# Patient Record
Sex: Female | Born: 1995 | Race: Black or African American | Hispanic: No | Marital: Single | Smoking: Never smoker
Health system: Southern US, Community
[De-identification: ages and names within clinical notes are randomized; demographics above are authoritative.]

## PROBLEM LIST (undated history)

## (undated) DIAGNOSIS — T7840XA Allergy, unspecified, initial encounter: Secondary | ICD-10-CM

## (undated) DIAGNOSIS — N766 Ulceration of vulva: Secondary | ICD-10-CM

## (undated) HISTORY — DX: Ulceration of vulva: N76.6

## (undated) HISTORY — DX: Allergy, unspecified, initial encounter: T78.40XA

---
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
## 2013-02-05 ENCOUNTER — Ambulatory Visit (INDEPENDENT_AMBULATORY_CARE_PROVIDER_SITE_OTHER): Payer: BC Managed Care – PPO | Admitting: Family Medicine

## 2013-02-05 VITALS — BP 110/70 | HR 68 | Temp 98.1°F | Resp 18 | Ht 64.0 in | Wt 141.0 lb

## 2013-02-05 DIAGNOSIS — Z020 Encounter for examination for admission to educational institution: Secondary | ICD-10-CM

## 2013-02-05 DIAGNOSIS — Z0000 Encounter for general adult medical examination without abnormal findings: Secondary | ICD-10-CM

## 2013-02-05 DIAGNOSIS — Z23 Encounter for immunization: Secondary | ICD-10-CM

## 2013-02-05 LAB — POCT URINALYSIS DIPSTICK
Leukocytes, UA: NEGATIVE
Nitrite, UA: NEGATIVE
Protein, UA: NEGATIVE
Urobilinogen, UA: 0.2
pH, UA: 6.5

## 2013-02-05 LAB — POCT CBC
Granulocyte percent: 66.8 %G (ref 37–80)
HCT, POC: 39.5 % (ref 37.7–47.9)
MCV: 93.4 fL (ref 80–97)
RBC: 4.23 M/uL (ref 4.04–5.48)

## 2013-02-05 NOTE — Patient Instructions (Addendum)
Go the the [facility] on [address] to get your 2nd Varicella (chicken pox) vaccine.  You can return in 2 months for your second Gardasil (HPV) vaccine - you will not need to see a doctor for this - will be a quick in-and-out visit just to get your shot.  Medroxyprogesterone injection [Contraceptive] What is this medicine? MEDROXYPROGESTERONE (me DROX ee proe JES te rone) contraceptive injections prevent pregnancy. They provide effective birth control for 3 months. Depo-subQ Provera 104 is also used for treating pain related to endometriosis. This medicine may be used for other purposes; ask your health care provider or pharmacist if you have questions. What should I tell my health care provider before I take this medicine? They need to know if you have any of these conditions: -frequently drink alcohol -asthma -blood vessel disease or a history of a blood clot in the lungs or legs -bone disease such as osteoporosis -breast cancer -diabetes -eating disorder (anorexia nervosa or bulimia) -high blood pressure -HIV infection or AIDS -kidney disease -liver disease -mental depression -migraine -seizures (convulsions) -stroke -tobacco smoker -vaginal bleeding -an unusual or allergic reaction to medroxyprogesterone, other hormones, medicines, foods, dyes, or preservatives -pregnant or trying to get pregnant -breast-feeding How should I use this medicine? Depo-Provera Contraceptive injection is given into a muscle. Depo-subQ Provera 104 injection is given under the skin. These injections are given by a health care professional. You must not be pregnant before getting an injection. The injection is usually given during the first 5 days after the start of a menstrual period or 6 weeks after delivery of a baby. Talk to your pediatrician regarding the use of this medicine in children. Special care may be needed. These injections have been used in female children who have  started having menstrual periods. Overdosage: If you think you have taken too much of this medicine contact a poison control center or emergency room at once. NOTE: This medicine is only for you. Do not share this medicine with others. What if I miss a dose? Try not to miss a dose. You must get an injection once every 3 months to maintain birth control. If you cannot keep an appointment, call and reschedule it. If you wait longer than 13 weeks between Depo-Provera contraceptive injections or longer than 14 weeks between Depo-subQ Provera 104 injections, you could get pregnant. Use another method for birth control if you miss your appointment. You may also need a pregnancy test before receiving another injection. What may interact with this medicine? Do not take this medicine with any of the following medications: -bosentan This medicine may also interact with the following medications: -aminoglutethimide -antibiotics or medicines for infections, especially rifampin, rifabutin, rifapentine, and griseofulvin -aprepitant -barbiturate medicines such as phenobarbital or primidone -bexarotene -carbamazepine -medicines for seizures like ethotoin, felbamate, oxcarbazepine, phenytoin, topiramate -modafinil -St. John's wort This list may not describe all possible interactions. Give your health care provider a list of all the medicines, herbs, non-prescription drugs, or dietary supplements you use. Also tell them if you smoke, drink alcohol, or use illegal drugs. Some items may interact with your medicine. What should I watch for while using this medicine? This drug does not protect you against HIV infection (AIDS) or other sexually transmitted diseases. Use of this product may cause you to lose calcium from your bones. Loss of calcium may cause weak bones (osteoporosis). Only use this product for more than 2 years if other forms of birth control are not right for  you. The longer you use this product for  birth control the more likely you will be at risk for weak bones. Ask your health care professional how you can keep strong bones. You may have a change in bleeding pattern or irregular periods. Many females stop having periods while taking this drug. If you have received your injections on time, your chance of being pregnant is very low. If you think you may be pregnant, see your health care professional as soon as possible. Tell your health care professional if you want to get pregnant within the next year. The effect of this medicine may last a long time after you get your last injection. What side effects may I notice from receiving this medicine? Side effects that you should report to your doctor or health care professional as soon as possible: -allergic reactions like skin rash, itching or hives, swelling of the face, lips, or tongue -breast tenderness or discharge -breathing problems -changes in vision -depression -feeling faint or lightheaded, falls -fever -pain in the abdomen, chest, groin, or leg -problems with balance, talking, walking -unusually weak or tired -yellowing of the eyes or skin Side effects that usually do not require medical attention (report to your doctor or health care professional if they continue or are bothersome): -acne -fluid retention and swelling -headache -irregular periods, spotting, or absent periods -temporary pain, itching, or skin reaction at site where injected -weight gain This list may not describe all possible side effects. Call your doctor for medical advice about side effects. You may report side effects to FDA at 1-800-FDA-1088. Where should I keep my medicine? This does not apply. The injection will be given to you by a health care professional. NOTE: This sheet is a summary. It may not cover all possible information. If you have questions about this medicine, talk to your doctor, pharmacist, or health care provider.  2013, Elsevier/[employer]  Standard. (08/10/2008 6:37:56 PM)

## 2013-02-05 NOTE — Progress Notes (Signed)
Subjective:    Patient ID: Andrea Ramirez, female    DOB: Nov 02, 1995, 17 y.o.   MRN: [id number] Chief Complaint  Patient presents with  . 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  Occasionally has RLQ pelvic pressure for the past sev years - saw Dr. Normand Sloop, ob-gyn, for this and was put on naproxyn which helps some.  She was put on ortho-evra patch for dysmenorrhea. It did not help at all, it was hard to take off, and left a light patch on her skin so she stopped it a while ago. She cannot remember to take a pill every day and she does not want an implant in her arm.  She would consider Depo-Provera but was turned off by the weight gain.  She brought up her immunizations records with her. She is UTD on all required vaccines. Her TDaP will be due in 01/2017.  She has only received 1 varicella vaccine - in 1999, and has not received the HPV vaccines or meningococal vaccine.  She is UTD on all others inculding her Hep B series (not Hep A). Virgin, not sexually active. She did get a ppd test within the past year for her medical careers class.  Past Medical History  Diagnosis Date  . Genital labial ulcer 05/11/08  . Allergy    Current Outpatient Prescriptions on File Prior to Visit  Medication Sig Dispense Refill  . naproxen sodium (ANAPROX) 275 MG tablet Take 1 tablet (275 mg total) by mouth 2 (two) times daily with a meal.  60 tablet  2  . norelgestromin-ethinyl estradiol (ORTHO EVRA) 150-20 MCG/24HR transdermal patch Place 1 patch onto the skin once a week.  3 patch  12   No current facility-administered medications on file prior to visit.   No Known Allergies History reviewed. No pertinent past surgical history. Family History  Problem Relation Age of Onset  . Diabetes Father   . Hypertension Father   . Hypertension Maternal Grandfather    History   Social History  .  Marital Status: Single    Spouse Name: N/A    Number of Children: N/A  . Years of Education: N/A   Social History Main Topics  . Smoking status: Never Smoker   . Smokeless tobacco: Never Used  . Alcohol Use: No  . Drug Use: No  . Sexually Active: No   Other Topics Concern  . None   Social History Narrative  . None     Review of Systems  Respiratory: Negative for cough and shortness of breath.   Cardiovascular: Negative for chest pain.  Genitourinary: Negative for dysuria and vaginal discharge.  Skin: Negative for rash.  All other systems reviewed and are negative.       BP 110/70  Pulse 68  Temp(Src) 98.1 F (36.7 C) (Oral)  Resp 18  Ht 5\' 4"  (1.626 m)  Wt 141 lb (63.957 kg)  BMI 24.19 kg/m2  SpO2 99%  LMP 01/18/2013 Objective:   Physical Exam  Constitutional: She is oriented to person, place, and time. She appears well-developed and well-nourished. No distress.  HENT:  Head: Normocephalic and atraumatic.  Right Ear: Tympanic membrane, external ear and ear canal normal.  Left Ear: Tympanic membrane, external ear and ear canal normal.  Nose: Nose normal. No mucosal edema or rhinorrhea.  Mouth/Throat: Uvula is midline, oropharynx is clear and  moist and mucous membranes are normal. No posterior oropharyngeal erythema.  Eyes: Conjunctivae and EOM are normal. Pupils are equal, round, and reactive to light. Right eye exhibits no discharge. Left eye exhibits no discharge. No scleral icterus.  Neck: Normal range of motion. Neck supple. No thyromegaly present.  Cardiovascular: Normal rate, regular rhythm, normal heart sounds and intact distal pulses.   Pulmonary/Chest: Effort normal and breath sounds normal. No respiratory distress.  Abdominal: Soft. Bowel sounds are normal. There is no tenderness.  Musculoskeletal: She exhibits no edema.  Lymphadenopathy:    She has no cervical adenopathy.  Neurological: She is alert and oriented to person, place, and time. She has  normal reflexes.  Skin: Skin is warm and dry. She is not diaphoretic. No erythema.  Psychiatric: She has a normal mood and affect. Her behavior is normal.          Results for orders placed in visit on 02/05/13  POCT URINALYSIS DIPSTICK      Result Value Range   Color, UA yellow     Clarity, UA clear     Glucose, UA neg     Bilirubin, UA neg     Ketones, UA neg     Spec Grav, UA 1.015     Blood, UA trace     pH, UA 6.5     Protein, UA neg     Urobilinogen, UA 0.2     Nitrite, UA neg     Leukocytes, UA Negative    POCT CBC      Result Value Range   WBC 6.9  4.6 - 10.2 K/uL   Lymph, poc 1.7  0.6 - 3.4   POC LYMPH PERCENT 25.3  10 - 50 %L   MID (cbc) 0.5  0 - 0.9   POC MID % 7.9  0 - 12 %M   POC Granulocyte 4.6  2 - 6.9   Granulocyte percent 66.8  37 - 80 %G   RBC 4.23  4.04 - 5.48 M/uL   Hemoglobin 12.4  12.2 - 16.2 g/dL   HCT, POC 40.9  81.1 - 47.9 %   MCV 93.4  80 - 97 fL   MCH, POC 29.3  27 - 31.2 pg   MCHC 31.4 (*) 31.8 - 35.4 g/dL   RDW, POC 91.4     Platelet Count, POC 261  142 - 424 K/uL   MPV 8.3  0 - 99.8 fL    Assessment & Plan:  Immunizations - recommend meningococcal vaccine and test today for varicilla immunity.  Had ppd results from other clinic. Rec starting HPV series.  Pt elected to get meningococcal vaccine and Gardasil #1 today. Will go to [facility] for varicella immunization.  HM - No concerns or restrictions, doing great. School forms completed and scanned in.  Dysmenorrhea - consider depo-provera - discussed risks/benefits.  Pt declines at this time - will just put up with sxs but will RTC if changes her mind.

## 2013-04-20 ENCOUNTER — Ambulatory Visit (INDEPENDENT_AMBULATORY_CARE_PROVIDER_SITE_OTHER): Payer: BC Managed Care – PPO

## 2013-04-20 DIAGNOSIS — Z23 Encounter for immunization: Secondary | ICD-10-CM

## 2013-10-09 ENCOUNTER — Ambulatory Visit (INDEPENDENT_AMBULATORY_CARE_PROVIDER_SITE_OTHER): Payer: 59 | Admitting: *Deleted

## 2013-10-09 DIAGNOSIS — Z23 Encounter for immunization: Secondary | ICD-10-CM

## 2014-12-28 ENCOUNTER — Ambulatory Visit (INDEPENDENT_AMBULATORY_CARE_PROVIDER_SITE_OTHER): Payer: BLUE CROSS/BLUE SHIELD | Admitting: Family Medicine

## 2014-12-28 VITALS — BP 98/64 | HR 74 | Temp 98.9°F | Resp 16 | Ht 64.5 in | Wt 136.0 lb

## 2014-12-28 DIAGNOSIS — Z111 Encounter for screening for respiratory tuberculosis: Secondary | ICD-10-CM

## 2014-12-28 NOTE — Progress Notes (Signed)
Patient is here for a PPD skin test. No history of TB exposure. Has hallux had negative tests in the past.  Objective: Healthy young lady  Assessment: PPD skin testing  Plan: Return in 48-72 hours tender test interpreted

## 2014-12-28 NOTE — Progress Notes (Signed)

## 2014-12-28 NOTE — Patient Instructions (Signed)
Return in 48-72 hours to have the skin test interpretation. If outside of that timeframe it is considered an invalid test.

## 2014-12-31 ENCOUNTER — Ambulatory Visit (INDEPENDENT_AMBULATORY_CARE_PROVIDER_SITE_OTHER): Payer: BLUE CROSS/BLUE SHIELD | Admitting: *Deleted

## 2014-12-31 DIAGNOSIS — Z111 Encounter for screening for respiratory tuberculosis: Secondary | ICD-10-CM

## 2014-12-31 LAB — TB SKIN TEST
Induration: 0 mm
TB Skin Test: NEGATIVE

## 2014-12-31 NOTE — Progress Notes (Signed)
Patient here today for TB read. Results negative 0mm. 

## 2015-04-02 ENCOUNTER — Ambulatory Visit (INDEPENDENT_AMBULATORY_CARE_PROVIDER_SITE_OTHER): Payer: BLUE CROSS/BLUE SHIELD | Admitting: Urgent Care

## 2015-04-02 VITALS — BP 110/78 | HR 79 | Temp 98.8°F | Resp 16 | Ht 64.5 in | Wt 139.2 lb

## 2015-04-02 DIAGNOSIS — M62838 Other muscle spasm: Secondary | ICD-10-CM

## 2015-04-02 DIAGNOSIS — M6248 Contracture of muscle, other site: Secondary | ICD-10-CM

## 2015-04-02 DIAGNOSIS — M549 Dorsalgia, unspecified: Secondary | ICD-10-CM

## 2015-04-02 DIAGNOSIS — M25519 Pain in unspecified shoulder: Secondary | ICD-10-CM

## 2015-04-02 DIAGNOSIS — M25562 Pain in left knee: Secondary | ICD-10-CM | POA: Diagnosis not present

## 2015-04-02 MED ORDER — MELOXICAM 15 MG PO TABS: 7.5000 mg | ORAL_TABLET | Freq: Every day | ORAL | Status: DC

## 2015-04-02 MED ORDER — CYCLOBENZAPRINE HCL 10 MG PO TABS: 5.0000 mg | ORAL_TABLET | Freq: Three times a day (TID) | ORAL | Status: DC | PRN
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
## 2015-04-02 NOTE — Patient Instructions (Signed)

## 2016-04-29 ENCOUNTER — Emergency Department (INDEPENDENT_AMBULATORY_CARE_PROVIDER_SITE_OTHER): Payer: BLUE CROSS/BLUE SHIELD

## 2016-04-29 ENCOUNTER — Emergency Department
Admission: EM | Admit: 2016-04-29 | Discharge: 2016-04-29 | Disposition: A | Discharge status: Home / Self Care | Payer: BLUE CROSS/BLUE SHIELD | Source: Home / Self Care | Attending: Emergency Medicine | Admitting: Emergency Medicine

## 2016-04-29 ENCOUNTER — Encounter: Payer: Self-pay | Admitting: Emergency Medicine

## 2016-04-29 DIAGNOSIS — M25519 Pain in unspecified shoulder: Secondary | ICD-10-CM

## 2016-04-29 DIAGNOSIS — M549 Dorsalgia, unspecified: Secondary | ICD-10-CM

## 2016-04-29 DIAGNOSIS — M25562 Pain in left knee: Secondary | ICD-10-CM

## 2016-04-29 DIAGNOSIS — M2141 Flat foot [pes planus] (acquired), right foot: Secondary | ICD-10-CM

## 2016-04-29 DIAGNOSIS — M79671 Pain in right foot: Secondary | ICD-10-CM | POA: Diagnosis not present

## 2016-04-29 MED ORDER — IBUPROFEN 800 MG PO TABS: 800.0000 mg | ORAL_TABLET | Freq: Three times a day (TID) | ORAL | 0 refills | Status: DC

## 2016-04-29 NOTE — Discharge Instructions (Signed)
Follow up with Dr. Denyse Amassorey for recheck in 1 week

## 2016-04-29 NOTE — ED Triage Notes (Signed)
Pt c/o right anterior foot pain that started x2 days ago. No known injury. Pain is worse with movement.

## 2016-04-30 NOTE — ED Provider Notes (Signed)
[facility] DEPT Provider Note   CSN: [id number] Arrival date & time: 04/29/16  1758     History   Chief Complaint Chief Complaint  Patient presents with  . Foot Pain    HPI Andrea Ramirez is a 20 y.o. female.  The history is provided by the patient. No language interpreter was used.  Foot Pain  This is a new problem. The current episode started 2 days ago. The problem occurs constantly. The problem has been gradually worsening. The symptoms are aggravated by walking. Nothing relieves the symptoms. She has tried nothing for the symptoms. The treatment provided no relief.    Past Medical History:  Diagnosis Date  . Allergy   . Genital labial ulcer 05/11/08    Patient Active Problem List   Diagnosis Date Noted  . Dysmenorrhea 02/16/2012    History reviewed. No pertinent surgical history.  OB History    No data available       Home Medications    Prior to Admission medications   Medication Sig Start Date End Date Taking? Authorizing Provider  cyclobenzaprine (FLEXERIL) 10 MG tablet Take 0.5-1 tablets (5-10 mg total) by mouth 3 (three) times daily as needed for muscle spasms. 04/02/15   Wallis Bamberg, PA-C  ibuprofen (ADVIL,MOTRIN) 800 MG tablet Take 1 tablet (800 mg total) by mouth 3 (three) times daily. 04/29/16   Elson Areas, PA-C  meloxicam (MOBIC) 15 MG tablet Take 0.5-1 tablets (7.5-15 mg total) by mouth daily. 04/02/15   Wallis Bamberg, PA-C    Family History Family History  Problem Relation Age of Onset  . Diabetes Father   . Hypertension Father   . Hypertension Maternal Grandfather     Social History Social History  Substance Use Topics  . Smoking status: Never Smoker  . Smokeless tobacco: Never Used  . Alcohol use No     Allergies   Review of patient's allergies indicates no known allergies.   Review of Systems Review of Systems  All other systems reviewed and are negative.    Physical Exam Updated Vital Signs BP 118/77 (BP Location:  Right Arm)   Pulse 81   Temp 98.1 F (36.7 C) (Oral)   Wt 66.7 kg   LMP 01/30/2016   SpO2 99%   BMI 24.84 kg/m   Physical Exam  Constitutional: She is oriented to person, place, and time. She appears well-developed and well-nourished.  HENT:  Head: Normocephalic.  Eyes: EOM are normal.  Neck: Normal range of motion.  Pulmonary/Chest: Effort normal.  Abdominal: She exhibits no distension.  Musculoskeletal: Normal range of motion.  Tender medial right foot,  Pain with moving,  nv and ns intact   Neurological: She is alert and oriented to person, place, and time.  Psychiatric: She has a normal mood and affect.  Nursing note and vitals reviewed.    ED Treatments / Results  Labs (all labs ordered are listed, but only abnormal results are displayed) Labs Reviewed - No data to display  EKG  EKG Interpretation None       Radiology Dg Foot Complete Right  Result Date: 04/29/2016 CLINICAL DATA:  Dorsal and medial foot pain for 2 days. No bruising but mild swelling noted. EXAM: RIGHT FOOT COMPLETE - 3+ VIEW COMPARISON:  None. FINDINGS: There is no evidence of fracture or dislocation. There is no evidence of arthropathy or other focal bone abnormality. Soft tissues are unremarkable. There is pes planus. IMPRESSION: Pes planus of the right foot. Electronically Signed  By: Tollie Ethavid  Kwon M.D.   On: 04/29/2016 19:08    Procedures Procedures (including critical care time)  Medications Ordered in ED Medications - No data to display   Initial Impression / Assessment and Plan / ED Course  I have reviewed the triage vital signs and the nursing notes.  Pertinent labs & imaging results that were available during my care of the patient were reviewed by me and considered in my medical decision making (see chart for details).  Clinical Course      Final Clinical Impressions(s) / ED Diagnoses   Final diagnoses:  Foot pain, right   An After Visit Summary was printed and given to  the patient. New Prescriptions Discharge Medication List as of 04/29/2016  7:17 PM    START taking these medications   Details  ibuprofen (ADVIL,MOTRIN) 800 MG tablet Take 1 tablet (800 mg total) by mouth 3 (three) times daily., Starting Wed 04/29/2016, Normal         Lonia SkinnerLeslie K MurphysSofia, [place] JerseyPA-C 04/30/16 1003

## 2017-04-24 IMAGING — DX DG FOOT COMPLETE 3+V*R*
3 series · 3 of 3 positions shown · non-contrast
Comparison: None.

CLINICAL DATA: Dorsal and medial foot pain for 2 days. No bruising
but mild swelling noted.

EXAM:
RIGHT FOOT COMPLETE - 3+ VIEW

[foot ap]
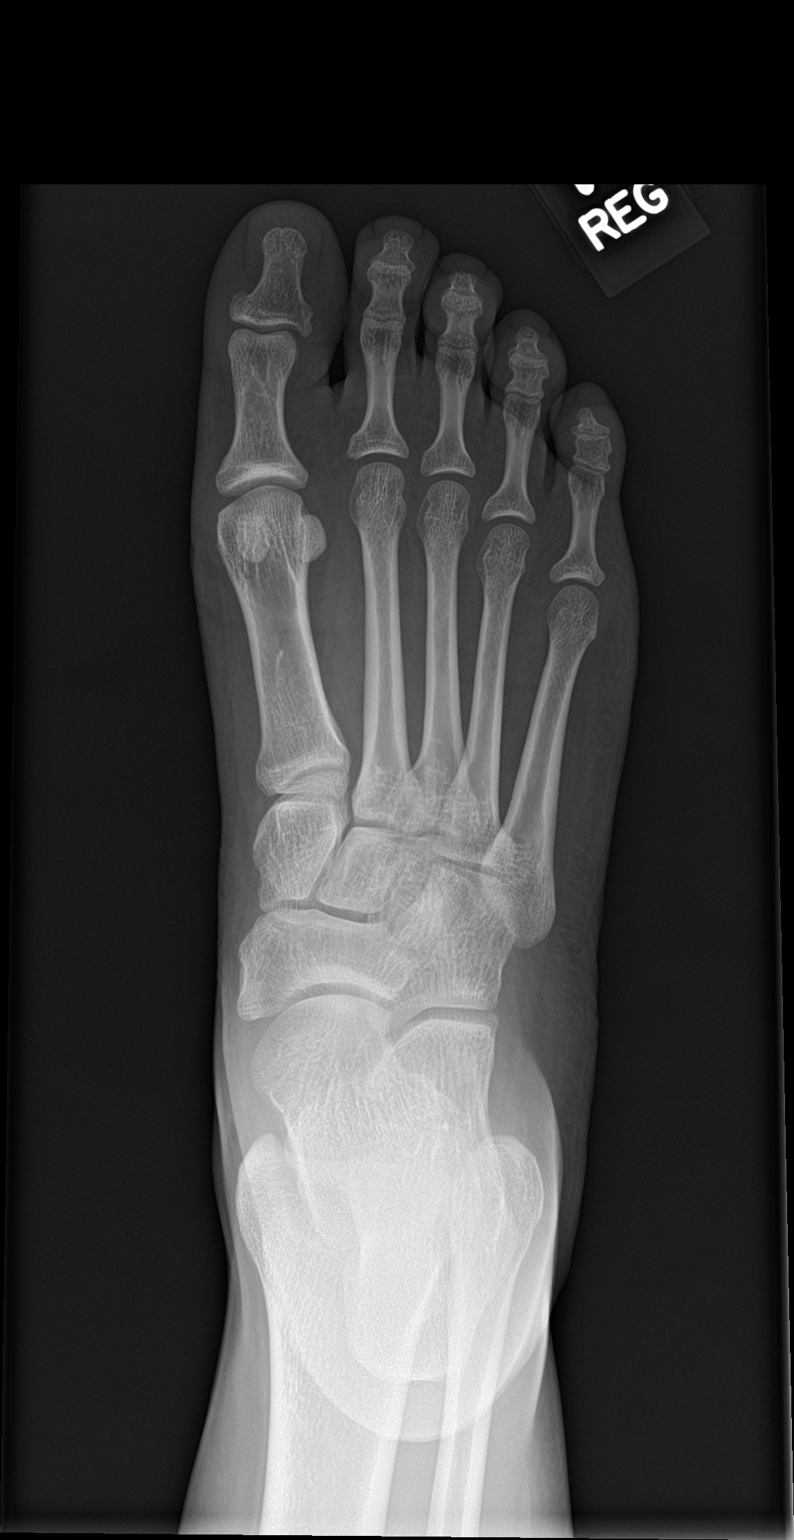

[foot obl]
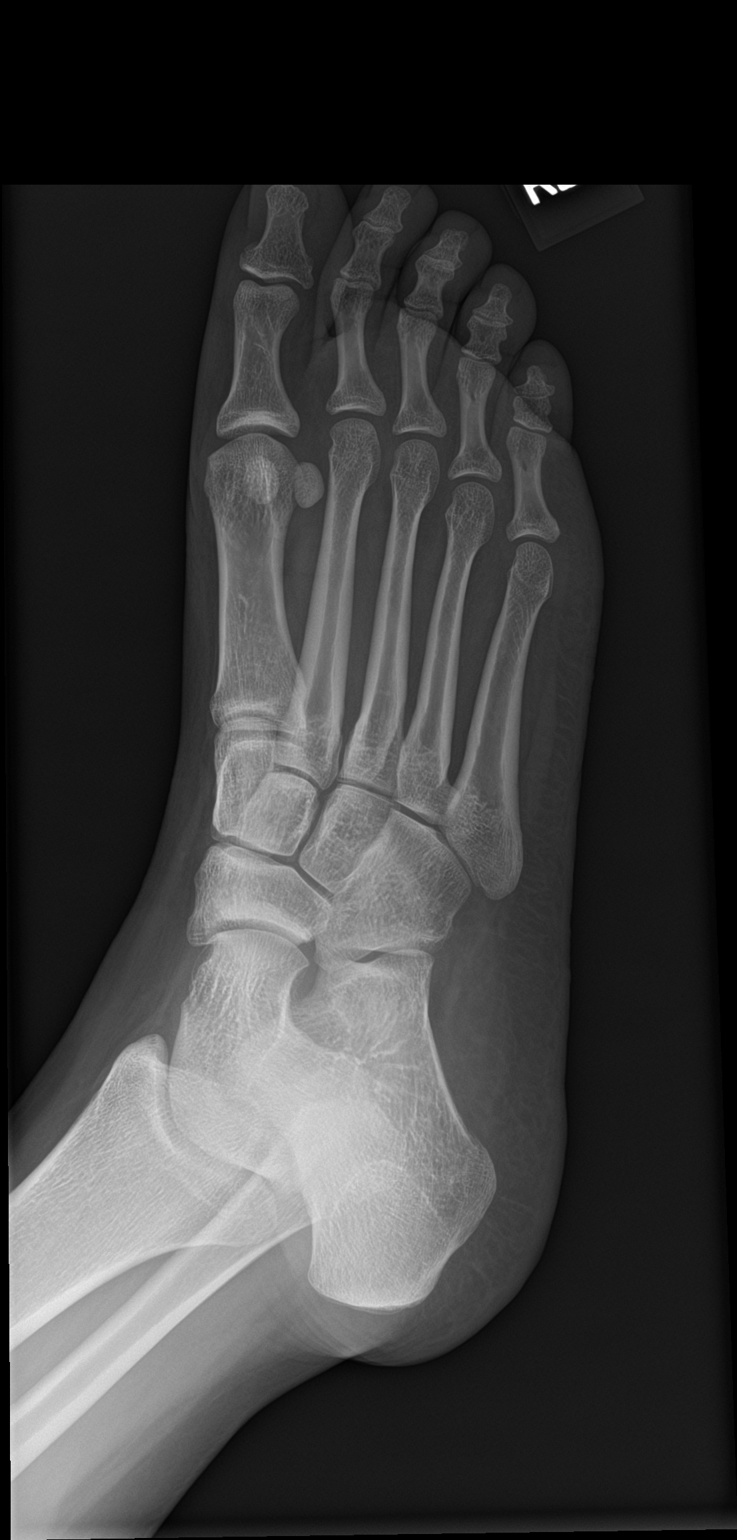

[foot lat]
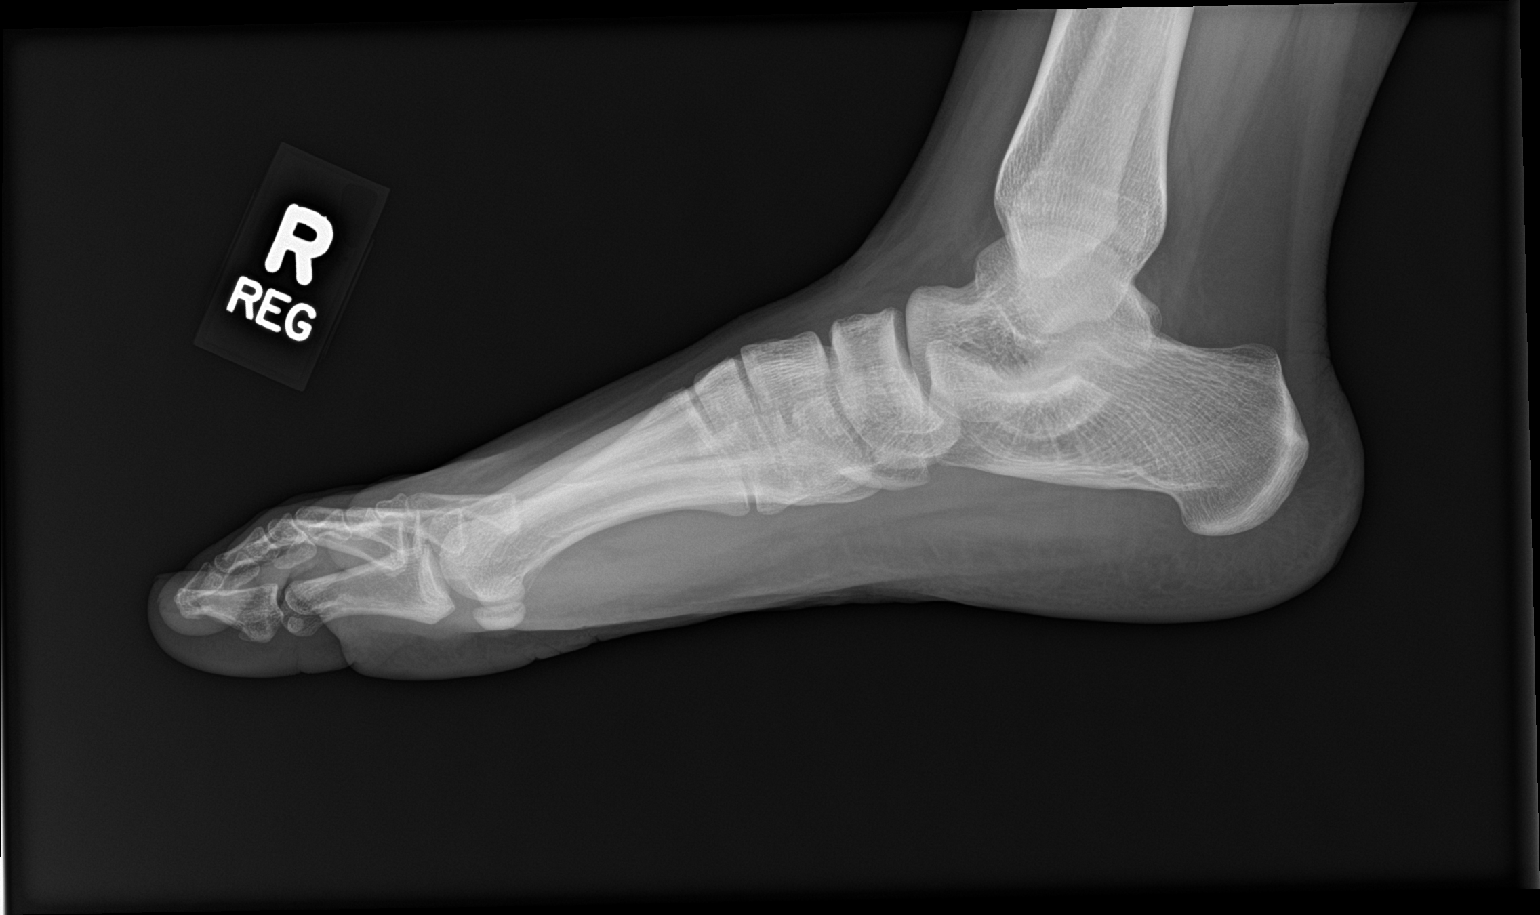

[3 of 3 positions shown; findings below may reference images not displayed]

FINDINGS: There is no evidence of fracture or dislocation. There is no
evidence of arthropathy or other focal bone abnormality. Soft
tissues are unremarkable. There is pes planus.
IMPRESSION: Pes planus of the right foot.

## 2018-01-28 ENCOUNTER — Ambulatory Visit (INDEPENDENT_AMBULATORY_CARE_PROVIDER_SITE_OTHER): Payer: BLUE CROSS/BLUE SHIELD | Admitting: Urgent Care

## 2018-01-28 ENCOUNTER — Encounter: Payer: Self-pay | Admitting: Urgent Care

## 2018-01-28 VITALS — BP 125/74 | HR 85 | Temp 98.8°F | Resp 17 | Ht 64.5 in | Wt 165.0 lb

## 2018-01-28 DIAGNOSIS — N390 Urinary tract infection, site not specified: Secondary | ICD-10-CM

## 2018-01-28 DIAGNOSIS — R319 Hematuria, unspecified: Secondary | ICD-10-CM | POA: Diagnosis not present

## 2018-01-28 DIAGNOSIS — N3090 Cystitis, unspecified without hematuria: Secondary | ICD-10-CM | POA: Diagnosis not present

## 2018-01-28 DIAGNOSIS — R300 Dysuria: Secondary | ICD-10-CM

## 2018-01-28 LAB — POC MICROSCOPIC URINALYSIS (UMFC)

## 2018-01-28 LAB — POCT URINALYSIS DIP (MANUAL ENTRY)
BILIRUBIN UA: NEGATIVE
BILIRUBIN UA: NEGATIVE mg/dL
Glucose, UA: NEGATIVE mg/dL
NITRITE UA: POSITIVE — AB
PH UA: 6 (ref 5.0–8.0)
Spec Grav, UA: 1.02 (ref 1.010–1.025)
Urobilinogen, UA: 0.2 E.U./dL

## 2018-01-28 LAB — POCT URINE PREGNANCY: PREG TEST UR: NEGATIVE

## 2018-01-28 MED ORDER — PHENAZOPYRIDINE HCL 200 MG PO TABS: 200.0000 mg | ORAL_TABLET | Freq: Three times a day (TID) | ORAL | 0 refills | Status: DC | PRN

## 2018-01-28 MED ORDER — NITROFURANTOIN MONOHYD MACRO 100 MG PO CAPS: 100.0000 mg | ORAL_CAPSULE | Freq: Two times a day (BID) | ORAL | 0 refills | Status: DC

## 2018-01-28 NOTE — Progress Notes (Signed)
MRN: [id number] DOB: 1995-10-21  Subjective:   Andrea Ramirez is a 22 y.o. female presenting for 3 history of dysuria, cloudy malordorous urine and low back pain. Has not tried any medications for relief. Denies fever, hematuria, urinary frequency, urinary urgency, flank pain, abdominal pain, pelvic pain, genital rash and vaginal discharge, nausea and vomiting.  She is sexually active with her boyfriend on, does not always use condoms for protection.  She does not that he had some pain of the penis.  Reports that she recently had STI testing and is not interested in that today.  Chandi is not currently taking any medications. Patient has No Known Allergies. Elwyn  has a past medical history of Allergy and Genital labial ulcer (05/11/08). Denies past surgical history.  Objective:   Vitals: BP 125/74   Pulse 85   Temp 98.8 F (37.1 C) (Oral)   Resp 17   Ht 5' 4.5" (1.638 m)   Wt 165 lb (74.8 kg)   SpO2 98%   BMI 27.88 kg/m   Physical Exam  Constitutional: She is oriented to person, place, and time. She appears well-developed and well-nourished.  Cardiovascular: Normal rate, regular rhythm and intact distal pulses. Exam reveals no gallop and no friction rub.  No murmur heard. Pulmonary/Chest: No respiratory distress. She has no wheezes. She has no rales.  Abdominal: Soft. Bowel sounds are normal. She exhibits no distension and no mass. There is no tenderness. There is no rebound and no guarding.  No CVA tenderness.  Musculoskeletal: She exhibits no edema.  Neurological: She is alert and oriented to person, place, and time.  Skin: Skin is warm and dry. No rash noted. No erythema. No pallor.  Psychiatric: She has a normal mood and affect.   Results for orders placed or performed in visit on 01/28/18 (from the past 24 hour(s))  POCT urinalysis dipstick     Status: Abnormal   Collection Time: 01/28/18  8:32 AM  Result Value Ref Range   Color, UA yellow yellow   Clarity, UA cloudy  (A) clear   Glucose, UA negative negative mg/dL   Bilirubin, UA negative negative   Ketones, POC UA negative negative mg/dL   Spec Grav, UA 4.098 1.191 - 1.025   Blood, UA moderate (A) negative   pH, UA 6.0 5.0 - 8.0   Protein Ur, POC =30 (A) negative mg/dL   Urobilinogen, UA 0.2 0.2 or 1.0 E.U./dL   Nitrite, UA Positive (A) Negative   Leukocytes, UA Moderate (2+) (A) Negative  POCT Microscopic Urinalysis ([facility])     Status: Abnormal   Collection Time: 01/28/18  8:36 AM  Result Value Ref Range   WBC,UR,HPF,POC Too numerous to count  (A) None WBC/hpf   RBC,UR,HPF,POC Many (A) None RBC/hpf   Bacteria Many (A) None, Too numerous to count   Mucus Present (A) Absent   Epithelial Cells, UR Per Microscopy Moderate (A) None, Too numerous to count cells/hpf  POCT urine pregnancy     Status: None   Collection Time: 01/28/18  8:36 AM  Result Value Ref Range   Preg Test, Ur Negative Negative    Assessment and Plan :   Cystitis  Dysuria - Plan: POCT urinalysis dipstick, POCT Microscopic Urinalysis ([facility]), Urine Culture, POCT urine pregnancy  Urinary tract infection with hematuria, site unspecified  Will start patient on Macrobid, urine culture pending.  Patient is to hydrate aggressively with at least 2 L water daily, Pyridium for dysuria.  Return to clinic  precautions discussed.  Wallis BambergMario Mustaf Antonacci, PA-C [facility] [phone] 01/28/2018 8:41 AM

## 2018-01-28 NOTE — Patient Instructions (Addendum)
Urinary Tract Infection, Adult A urinary tract infection (UTI) is an infection of any part of the urinary tract. The urinary tract includes the:  Kidneys.  Ureters.  Bladder.  Urethra.  These organs make, store, and get rid of pee (urine) in the body. Follow these instructions at home:  Take over-the-counter and prescription medicines only as told by your doctor.  If you were prescribed an antibiotic medicine, take it as told by your doctor. Do not stop taking the antibiotic even if you start to feel better.  Avoid the following drinks: ? Alcohol. ? Caffeine. ? Tea. ? Carbonated drinks.  Drink enough fluid to keep your pee clear or pale yellow.  Keep all follow-up visits as told by your doctor. This is important.  Make sure to: ? Empty your bladder often and completely. Do not to hold pee for long periods of time. ? Empty your bladder before and after sex. ? Wipe from front to back after a bowel movement if you are female. Use each tissue one time when you wipe. Contact a doctor if:  You have back pain.  You have a fever.  You feel sick to your stomach (nauseous).  You throw up (vomit).  Your symptoms do not get better after 3 days.  Your symptoms go away and then come back. Get help right away if:  You have very bad back pain.  You have very bad lower belly (abdominal) pain.  You are throwing up and cannot keep down any medicines or water. This information is not intended to replace advice given to you by your health care provider. Make sure you discuss any questions you have with your health care provider. Document Released: 01/06/2008 Document Revised: 12/26/2015 Document Reviewed: 06/10/2015 Elsevier Interactive Patient Education  2018 [employer].   Dysuria Dysuria is pain or discomfort while urinating. The pain or discomfort may be felt in the tube that carries urine out of the bladder (urethra) or in the surrounding tissue of the genitals. The pain  may also be felt in the groin area, lower abdomen, and lower back. You may have to urinate frequently or have the sudden feeling that you have to urinate (urgency). Dysuria can affect both men and women, but is more common in women. Dysuria can be caused by many different things, including:  Urinary tract infection in women.  Infection of the kidney or bladder.  Kidney stones or bladder stones.  Certain sexually transmitted infections (STIs), such as chlamydia.  Dehydration.  Inflammation of the vagina.  Use of certain medicines.  Use of certain soaps or scented products that cause irritation.  Follow these instructions at home: Watch your dysuria for any changes. The following actions may help to reduce any discomfort you are feeling:  Drink enough fluid to keep your urine clear or pale yellow.  Empty your bladder often. Avoid holding urine for long periods of time.  After a bowel movement or urination, women should cleanse from front to back, using each tissue only once.  Empty your bladder after sexual intercourse.  Take medicines only as directed by your health care provider.  If you were prescribed an antibiotic medicine, finish it all even if you start to feel better.  Avoid caffeine, tea, and alcohol. They can irritate the bladder and make dysuria worse. In men, alcohol may irritate the prostate.  Keep all follow-up visits as directed by your health care provider. This is important.  If you had any tests done to find the  cause of dysuria, it is your responsibility to obtain your test results. Ask the lab or department performing the test when and how you will get your results. Talk with your health care provider if you have any questions about your results.  Contact a health care provider if:  You develop pain in your back or sides.  You have a fever.  You have nausea or vomiting.  You have blood in your urine.  You are not urinating as often as you usually  do. Get help right away if:  You pain is severe and not relieved with medicines.  You are unable to hold down any fluids.  You or someone else notices a change in your mental function.  You have a rapid heartbeat at rest.  You have shaking or chills.  You feel extremely weak. This information is not intended to replace advice given to you by your health care provider. Make sure you discuss any questions you have with your health care provider. Document Released: 04/17/2004 Document Revised: 12/26/2015 Document Reviewed: 03/15/2014 Elsevier Interactive Patient Education  2018 [employer]     IF you received an x-ray today, you will receive an invoice from [facility]. Please contact [facility] at [phone] with questions or concerns regarding your invoice.   IF you received labwork today, you will receive an invoice from [facility]. Please contact [facility] at [phone] with questions or concerns regarding your invoice.   Our billing staff will not be able to assist you with questions regarding bills from these companies.  You will be contacted with the lab results as soon as they are available. The fastest way to get your results is to activate your My Chart account. Instructions are located on the last page of this paperwork. If you have not heard from us regarding the results in 2 weeks, please contact this office.

## 2018-01-31 LAB — URINE CULTURE

## 2018-02-01 ENCOUNTER — Encounter: Payer: Self-pay | Admitting: Urgent Care

## 2018-02-10 ENCOUNTER — Other Ambulatory Visit: Payer: Self-pay

## 2018-02-10 ENCOUNTER — Ambulatory Visit (INDEPENDENT_AMBULATORY_CARE_PROVIDER_SITE_OTHER): Payer: BLUE CROSS/BLUE SHIELD | Admitting: Urgent Care

## 2018-02-10 ENCOUNTER — Encounter: Payer: Self-pay | Admitting: Urgent Care

## 2018-02-10 VITALS — BP 113/74 | HR 71 | Temp 98.5°F | Resp 16 | Ht 64.5 in | Wt 162.6 lb

## 2018-02-10 DIAGNOSIS — Z13228 Encounter for screening for other metabolic disorders: Secondary | ICD-10-CM

## 2018-02-10 DIAGNOSIS — Z1329 Encounter for screening for other suspected endocrine disorder: Secondary | ICD-10-CM | POA: Diagnosis not present

## 2018-02-10 DIAGNOSIS — Z124 Encounter for screening for malignant neoplasm of cervix: Secondary | ICD-10-CM | POA: Diagnosis not present

## 2018-02-10 DIAGNOSIS — Z130 Encounter for screening for diseases of the blood and blood-forming organs and certain disorders involving the immune mechanism: Secondary | ICD-10-CM | POA: Diagnosis not present

## 2018-02-10 DIAGNOSIS — Z0000 Encounter for general adult medical examination without abnormal findings: Secondary | ICD-10-CM | POA: Diagnosis not present

## 2018-02-10 DIAGNOSIS — Z833 Family history of diabetes mellitus: Secondary | ICD-10-CM

## 2018-02-10 DIAGNOSIS — Z1321 Encounter for screening for nutritional disorder: Secondary | ICD-10-CM | POA: Diagnosis not present

## 2018-02-10 DIAGNOSIS — Z114 Encounter for screening for human immunodeficiency virus [HIV]: Secondary | ICD-10-CM

## 2018-02-10 DIAGNOSIS — Z23 Encounter for immunization: Secondary | ICD-10-CM

## 2018-02-10 NOTE — Patient Instructions (Signed)
Health Maintenance, Female Adopting a healthy lifestyle and getting preventive care can go a long way to promote health and wellness. Talk with your health care provider about what schedule of regular examinations is right for you. This is a good chance for you to check in with your provider about disease prevention and staying healthy. In between checkups, there are plenty of things you can do on your own. Experts have done a lot of research about which lifestyle changes and preventive measures are most likely to keep you healthy. Ask your health care provider for more information. Weight and diet Eat a healthy diet  Be sure to include plenty of vegetables, fruits, low-fat dairy products, and lean protein.  Do not eat a lot of foods high in solid fats, added sugars, or salt.  Get regular exercise. This is one of the most important things you can do for your health. ? Most adults should exercise for at least 150 minutes each week. The exercise should increase your heart rate and make you sweat (moderate-intensity exercise). ? Most adults should also do strengthening exercises at least twice a week. This is in addition to the moderate-intensity exercise.  Maintain a healthy weight  Body mass index (BMI) is a measurement that can be used to identify possible weight problems. It estimates body fat based on height and weight. Your health care provider can help determine your BMI and help you achieve or maintain a healthy weight.  For females 20 years of age and older: ? A BMI below 18.5 is considered underweight. ? A BMI of 18.5 to 24.9 is normal. ? A BMI of 25 to 29.9 is considered overweight. ? A BMI of 30 and above is considered obese.  Watch levels of cholesterol and blood lipids  You should start having your blood tested for lipids and cholesterol at 24 years of age, then have this test every 5 years.  You may need to have your cholesterol levels checked more often if: ? Your lipid or  cholesterol levels are high. ? You are older than 24 years of age. ? You are at high risk for heart disease.  Cancer screening Lung Cancer  Lung cancer screening is recommended for adults 55-80 years old who are at high risk for lung cancer because of a history of smoking.  A yearly low-dose CT scan of the lungs is recommended for people who: ? Currently smoke. ? Have quit within the past 15 years. ? Have at least a 30-pack-year history of smoking. A pack year is smoking an average of one pack of cigarettes a day for 1 year.  Yearly screening should continue until it has been 15 years since you quit.  Yearly screening should stop if you develop a health problem that would prevent you from having lung cancer treatment.  Breast Cancer  Practice breast self-awareness. This means understanding how your breasts normally appear and feel.  It also means doing regular breast self-exams. Let your health care provider know about any changes, no matter how small.  If you are in your 20s or 30s, you should have a clinical breast exam (CBE) by a health care provider every 1-3 years as part of a regular health exam.  If you are 40 or older, have a CBE every year. Also consider having a breast X-ray (mammogram) every year.  If you have a family history of breast cancer, talk to your health care provider about genetic screening.  If you are at high risk   for breast cancer, talk to your health care provider about having an MRI and a mammogram every year.  Breast cancer gene (BRCA) assessment is recommended for women who have family members with BRCA-related cancers. BRCA-related cancers include: ? Breast. ? Ovarian. ? Tubal. ? Peritoneal cancers.  Results of the assessment will determine the need for genetic counseling and BRCA1 and BRCA2 testing.  Cervical Cancer Your health care provider may recommend that you be screened regularly for cancer of the pelvic organs (ovaries, uterus, and  vagina). This screening involves a pelvic examination, including checking for microscopic changes to the surface of your cervix (Pap test). You may be encouraged to have this screening done every 3 years, beginning at age 22.  For women ages 56-65, health care providers may recommend pelvic exams and Pap testing every 3 years, or they may recommend the Pap and pelvic exam, combined with testing for human papilloma virus (HPV), every 5 years. Some types of HPV increase your risk of cervical cancer. Testing for HPV may also be done on women of any age with unclear Pap test results.  Other health care providers may not recommend any screening for nonpregnant women who are considered low risk for pelvic cancer and who do not have symptoms. Ask your health care provider if a screening pelvic exam is right for you.  If you have had past treatment for cervical cancer or a condition that could lead to cancer, you need Pap tests and screening for cancer for at least 20 years after your treatment. If Pap tests have been discontinued, your risk factors (such as having a new sexual partner) need to be reassessed to determine if screening should resume. Some women have medical problems that increase the chance of getting cervical cancer. In these cases, your health care provider may recommend more frequent screening and Pap tests.  Colorectal Cancer  This type of cancer can be detected and often prevented.  Routine colorectal cancer screening usually begins at 24 years of age and continues through 24 years of age.  Your health care provider may recommend screening at an earlier age if you have risk factors for colon cancer.  Your health care provider may also recommend using home test kits to check for hidden blood in the stool.  A small camera at the end of a tube can be used to examine your colon directly (sigmoidoscopy or colonoscopy). This is done to check for the earliest forms of colorectal  cancer.  Routine screening usually begins at age 33.  Direct examination of the colon should be repeated every 5-10 years through 24 years of age. However, you may need to be screened more often if early forms of precancerous polyps or small growths are found.  Skin Cancer  Check your skin from head to toe regularly.  Tell your health care provider about any new moles or changes in moles, especially if there is a change in a mole's shape or color.  Also tell your health care provider if you have a mole that is larger than the size of a pencil eraser.  Always use sunscreen. Apply sunscreen liberally and repeatedly throughout the day.  Protect yourself by wearing long sleeves, pants, a wide-brimmed hat, and sunglasses whenever you are outside.  Heart disease, diabetes, and high blood pressure  High blood pressure causes heart disease and increases the risk of stroke. High blood pressure is more likely to develop in: ? People who have blood pressure in the high end of  the normal range (130-139/85-89 mm Hg). ? People who are overweight or obese. ? People who are African American.  If you are 21-29 years of age, have your blood pressure checked every 3-5 years. If you are 3 years of age or older, have your blood pressure checked every year. You should have your blood pressure measured twice-once when you are at a hospital or clinic, and once when you are not at a hospital or clinic. Record the average of the two measurements. To check your blood pressure when you are not at a hospital or clinic, you can use: ? An automated blood pressure machine at a pharmacy. ? A home blood pressure monitor.  If you are between 17 years and 37 years old, ask your health care provider if you should take aspirin to prevent strokes.  Have regular diabetes screenings. This involves taking a blood sample to check your fasting blood sugar level. ? If you are at a normal weight and have a low risk for diabetes,  have this test once every three years after 24 years of age. ? If you are overweight and have a high risk for diabetes, consider being tested at a younger age or more often. Preventing infection Hepatitis B  If you have a higher risk for hepatitis B, you should be screened for this virus. You are considered at high risk for hepatitis B if: ? You were born in a country where hepatitis B is common. Ask your health care provider which countries are considered high risk. ? Your parents were born in a high-risk country, and you have not been immunized against hepatitis B (hepatitis B vaccine). ? You have HIV or AIDS. ? You use needles to inject street drugs. ? You live with someone who has hepatitis B. ? You have had sex with someone who has hepatitis B. ? You get hemodialysis treatment. ? You take certain medicines for conditions, including cancer, organ transplantation, and autoimmune conditions.  Hepatitis C  Blood testing is recommended for: ? Everyone born from [birth year] through 1967. ? Anyone with known risk factors for hepatitis C.  Sexually transmitted infections (STIs)  You should be screened for sexually transmitted infections (STIs) including gonorrhea and chlamydia if: ? You are sexually active and are younger than 24 years of age. ? You are older than 24 years of age and your health care provider tells you that you are at risk for this type of infection. ? Your sexual activity has changed since you were last screened and you are at an increased risk for chlamydia or gonorrhea. Ask your health care provider if you are at risk.  If you do not have HIV, but are at risk, it may be recommended that you take a prescription medicine daily to prevent HIV infection. This is called pre-exposure prophylaxis (PrEP). You are considered at risk if: ? You are sexually active and do not regularly use condoms or know the HIV status of your partner(s). ? You take drugs by injection. ? You are  sexually active with a partner who has HIV.  Talk with your health care provider about whether you are at high risk of being infected with HIV. If you choose to begin PrEP, you should first be tested for HIV. You should then be tested every 3 months for as long as you are taking PrEP. Pregnancy  If you are premenopausal and you may become pregnant, ask your health care provider about preconception counseling.  If you may become  pregnant, take 400 to 800 micrograms (mcg) of folic acid every day.  If you want to prevent pregnancy, talk to your health care provider about birth control (contraception). Osteoporosis and menopause  Osteoporosis is a disease in which the bones lose minerals and strength with aging. This can result in serious bone fractures. Your risk for osteoporosis can be identified using a bone density scan.  If you are 74 years of age or older, or if you are at risk for osteoporosis and fractures, ask your health care provider if you should be screened.  Ask your health care provider whether you should take a calcium or vitamin D supplement to lower your risk for osteoporosis.  Menopause may have certain physical symptoms and risks.  Hormone replacement therapy may reduce some of these symptoms and risks. Talk to your health care provider about whether hormone replacement therapy is right for you. Follow these instructions at home:  Schedule regular health, dental, and eye exams.  Stay current with your immunizations.  Do not use any tobacco products including cigarettes, chewing tobacco, or electronic cigarettes.  If you are pregnant, do not drink alcohol.  If you are breastfeeding, limit how much and how often you drink alcohol.  Limit alcohol intake to no more than 1 drink per day for nonpregnant women. One drink equals 12 ounces of beer, 5 ounces of wine, or 1 ounces of hard liquor.  Do not use street drugs.  Do not share needles.  Ask your health care  provider for help if you need support or information about quitting drugs.  Tell your health care provider if you often feel depressed.  Tell your health care provider if you have ever been abused or do not feel safe at home. This information is not intended to replace advice given to you by your health care provider. Make sure you discuss any questions you have with your health care provider. Document Released: 02/02/2011 Document Revised: 12/26/2015 Document Reviewed: 04/23/2015 Elsevier Interactive Patient Education  2018 [employer].     IF you received an x-ray today, you will receive an invoice from [facility]. Please contact [facility] at [phone] with questions or concerns regarding your invoice.   IF you received labwork today, you will receive an invoice from [place]. Please contact [facility] at [phone] with questions or concerns regarding your invoice.   Our billing staff will not be able to assist you with questions regarding bills from these companies.  You will be contacted with the lab results as soon as they are available. The fastest way to get your results is to activate your My Chart account. Instructions are located on the last page of this paperwork. If you have not heard from [place] regarding the results in 2 weeks, please contact this office.

## 2018-02-10 NOTE — Progress Notes (Signed)
MRN: [id number]  Subjective:   Andrea Ramirez is a 22 y.o. female presenting for annual physical exam. Patient has a boyfriend. Works as a Lawyer. Has good relationships at home, has a good support network. Denies smoking cigarettes. Has ~3 alcohol drinks per week. Seldomly uses marijuana. Tries to exercise.  Medical care team includes: PCP: System, Pcp Not In Vision: Wears eye glasses, is due for an eye exam. Plans on setting this up. Dental: Gets dental cleanings once every 6 months. OB/GYN: Is due for a pap smear. Specialists: None.  Andrea Ramirez is not currently taking any medications and has No Known Allergies. Andrea Ramirez  has a past medical history of Allergy and Genital labial ulcer (05/11/08). Denies past surgical history. Her family history includes Diabetes in her father; Hypertension in her father and maternal grandfather.  Immunizations: Needs tdap.  Review of Systems  Constitutional: Negative for chills, diaphoresis, fever, malaise/fatigue and weight loss.  HENT: Negative for congestion, ear discharge, ear pain, hearing loss, nosebleeds, sore throat and tinnitus.   Eyes: Negative for blurred vision, double vision, photophobia, pain, discharge and redness.  Respiratory: Negative for cough, shortness of breath and wheezing.   Cardiovascular: Negative for chest pain, palpitations and leg swelling.  Gastrointestinal: Negative for abdominal pain, blood in stool, constipation, diarrhea, nausea and vomiting.  Genitourinary: Negative for dysuria, flank pain, frequency, hematuria and urgency.  Musculoskeletal: Negative for back pain, joint pain and myalgias.  Skin: Negative for itching and rash.  Neurological: Negative for dizziness, tingling, seizures, loss of consciousness, weakness and headaches.  Endo/Heme/Allergies: Negative for polydipsia.  Psychiatric/Behavioral: Negative for depression, hallucinations, memory loss, substance abuse and suicidal ideas. The patient is not nervous/anxious  and does not have insomnia.     Objective:   Vitals: BP 113/74   Pulse 71   Temp 98.5 F (36.9 C) (Oral)   Resp 16   Ht 5' 4.5" (1.638 m)   Wt 162 lb 9.6 oz (73.8 kg)   LMP 01/11/2018   SpO2 97%   BMI 27.48 kg/m   Physical Exam  Constitutional: She is oriented to person, place, and time. She appears well-developed and well-nourished.  HENT:  TM's intact bilaterally, no effusions or erythema. Nasal turbinates pink and moist, nasal passages patent. No sinus tenderness. Oropharynx clear, mucous membranes moist, dentition in good repair.  Eyes: Pupils are equal, round, and reactive to light. Conjunctivae and EOM are normal. Right eye exhibits no discharge. Left eye exhibits no discharge. No scleral icterus.  Neck: Normal range of motion. Neck supple. No thyromegaly present.  Cardiovascular: Normal rate, regular rhythm, normal heart sounds and intact distal pulses. Exam reveals no gallop and no friction rub.  No murmur heard. Pulmonary/Chest: Effort normal and breath sounds normal. No stridor. No respiratory distress. She has no wheezes. She has no rales.  Abdominal: Soft. Bowel sounds are normal. She exhibits no distension and no mass. There is no tenderness. There is no rebound and no guarding.  Genitourinary: Uterus is not deviated, not enlarged, not fixed and not tender. Cervix exhibits no motion tenderness, no discharge and no friability. Right adnexum displays no mass, no tenderness and no fullness. Left adnexum displays no mass, no tenderness and no fullness. No erythema, tenderness or bleeding in the vagina. No foreign body in the vagina. No signs of injury around the vagina. No vaginal discharge found.  Musculoskeletal: Normal range of motion. She exhibits no edema or tenderness.  Lymphadenopathy:    She has no cervical adenopathy.  Neurological:  She is alert and oriented to person, place, and time. She has normal reflexes.  Skin: Skin is warm and dry. No rash noted. No  erythema. No pallor.  Psychiatric: She has a normal mood and affect.   Assessment and Plan :   Annual physical exam  Screening for cervical cancer - Plan: Pap IG w/ reflex to HPV when ASC-U, CANCELED: Pap IG, CT/NG NAA, and HPV (high risk)  Screening for endocrine, nutritional, metabolic and immunity disorder - Plan: Comprehensive metabolic panel, Lipid panel, TSH  Screening for deficiency anemia - Plan: CBC  Family history of diabetes mellitus  Screening for HIV without presence of risk factors - Plan: HIV antibody  Need for Tdap vaccination - Plan: Tdap vaccine greater than or equal to 7yo IM  Patient is medically stable, healthy. Labs pending. Discussed healthy lifestyle, diet, exercise, preventative care, vaccinations, and addressed patient's concerns.     Wallis BambergMario Myrlene Riera, PA-C [facility] at [facility] [phone] 02/10/2018  3:51 PM

## 2018-02-10 NOTE — Progress Notes (Deleted)
    MRN: [id number] DOB: 08/15/1995  Subjective:   Andrea Ramirez is a 22 y.o. female presenting for    Andrea Ramirez currently has no medications in their medication list. Also has No Known Allergies.  Andrea Ramirez  has a past medical history of Allergy and Genital labial ulcer (05/11/08). Denies past surgical history.   Objective:   Vitals: BP 113/74   Pulse 71   Temp 98.5 F (36.9 C) (Oral)   Resp 16   Ht 5' 4.5" (1.638 m)   Wt 162 lb 9.6 oz (73.8 kg)   LMP 01/11/2018   SpO2 97%   BMI 27.48 kg/m   Physical Exam  Assessment and Plan :     Wallis BambergMario Chaysen Tillman, PA-C [facility] at [facility] [phone] 02/10/2018  3:36 PM

## 2018-02-11 LAB — CBC
HEMATOCRIT: 40 % (ref 34.0–46.6)
HEMOGLOBIN: 13.4 g/dL (ref 11.1–15.9)
MCH: 29.6 pg (ref 26.6–33.0)
MCHC: 33.5 g/dL (ref 31.5–35.7)
MCV: 88 fL (ref 79–97)
Platelets: 294 10*3/uL (ref 150–450)
RBC: 4.53 x10E6/uL (ref 3.77–5.28)
RDW: 12.6 % (ref 12.3–15.4)
WBC: 9.2 10*3/uL (ref 3.4–10.8)

## 2018-02-11 LAB — COMPREHENSIVE METABOLIC PANEL
A/G RATIO: 1.2 (ref 1.2–2.2)
ALBUMIN: 4.4 g/dL (ref 3.5–5.5)
ALK PHOS: 58 IU/L (ref 39–117)
ALT: 12 IU/L (ref 0–32)
AST: 19 IU/L (ref 0–40)
BUN / CREAT RATIO: 10 (ref 9–23)
BUN: 9 mg/dL (ref 6–20)
Bilirubin Total: 0.8 mg/dL (ref 0.0–1.2)
CO2: 23 mmol/L (ref 20–29)
CREATININE: 0.9 mg/dL (ref 0.57–1.00)
Calcium: 9.4 mg/dL (ref 8.7–10.2)
Chloride: 100 mmol/L (ref 96–106)
GFR calc Af Amer: 104 mL/min/{1.73_m2} (ref >59)
GFR, EST NON AFRICAN AMERICAN: 90 mL/min/{1.73_m2} (ref >59)
GLOBULIN, TOTAL: 3.6 g/dL (ref 1.5–4.5)
Glucose: 80 mg/dL (ref 65–99)
POTASSIUM: 3.7 mmol/L (ref 3.5–5.2)
SODIUM: 140 mmol/L (ref 134–144)
Total Protein: 8 g/dL (ref 6.0–8.5)

## 2018-02-11 LAB — LIPID PANEL
CHOL/HDL RATIO: 2.3 ratio (ref 0.0–4.4)
Cholesterol, Total: 155 mg/dL (ref 100–199)
HDL: 67 mg/dL (ref >39)
LDL CALC: 76 mg/dL (ref 0–99)
TRIGLYCERIDES: 58 mg/dL (ref 0–149)
VLDL Cholesterol Cal: 12 mg/dL (ref 5–40)

## 2018-02-11 LAB — HIV ANTIBODY (ROUTINE TESTING W REFLEX): HIV SCREEN 4TH GENERATION: NONREACTIVE

## 2018-02-11 LAB — TSH: TSH: 1.6 u[IU]/mL (ref 0.450–4.500)

## 2018-02-15 LAB — PAP IG W/ RFLX HPV ASCU: PAP Smear Comment: 0

## 2019-02-06 ENCOUNTER — Encounter: Payer: Self-pay | Admitting: Urgent Care

## 2024-02-18 ENCOUNTER — Encounter: Payer: Self-pay | Admitting: Advanced Practice Midwife
